# Patient Record
Sex: Female | Born: 1950 | Race: Black or African American | Hispanic: No | Marital: Married | Smoking: Never smoker
Health system: Southern US, Community
[De-identification: ages and names within clinical notes are randomized; demographics above are authoritative.]

## PROBLEM LIST (undated history)

## (undated) DIAGNOSIS — I10 Essential (primary) hypertension: Secondary | ICD-10-CM

## (undated) DIAGNOSIS — J45909 Unspecified asthma, uncomplicated: Secondary | ICD-10-CM

## (undated) DIAGNOSIS — R7303 Prediabetes: Secondary | ICD-10-CM

## (undated) HISTORY — DX: Unspecified asthma, uncomplicated: J45.909

## (undated) HISTORY — DX: Essential (primary) hypertension: I10

## (undated) HISTORY — DX: Prediabetes: R73.03

---

## 2007-01-24 ENCOUNTER — Ambulatory Visit (HOSPITAL_COMMUNITY): Admission: RE | Admit: 2007-01-24 | Discharge: 2007-01-24 | Payer: Self-pay | Admitting: Family Medicine

## 2007-12-03 ENCOUNTER — Ambulatory Visit (HOSPITAL_COMMUNITY): Admission: RE | Admit: 2007-12-03 | Discharge: 2007-12-03 | Payer: Self-pay | Admitting: Family Medicine

## 2008-12-24 IMAGING — CT CT CHEST W/ CM
1 series · 15 of 31 positions shown, 19 images · IV contrast (Omnipaque 300)
Comparison: Prior outside exam not provided for comparison.

CLINICAL DATA: Follow up abnormal CT chest with reported focal
density left apex

CT CHEST WITH CONTRAST
TECHNIQUE: Multidetector CT imaging of the chest was performed
following the standard protocol during bolus administration of
intravenous contrast. Sagittal and coronal images reconstructed
from axial data set.
Contrast: 80 ml Xmnipaque-022

[Series 2: chestroutine 5.0 b40f · axial · 0.63mm/px · z∈[-302,-38]mm · 15 of 59 slices shown, 19 images]
[im 3/59  mediastinal]
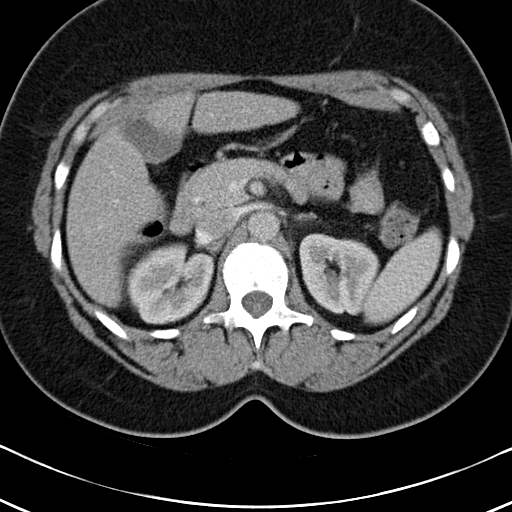
[im 3/59  lung]
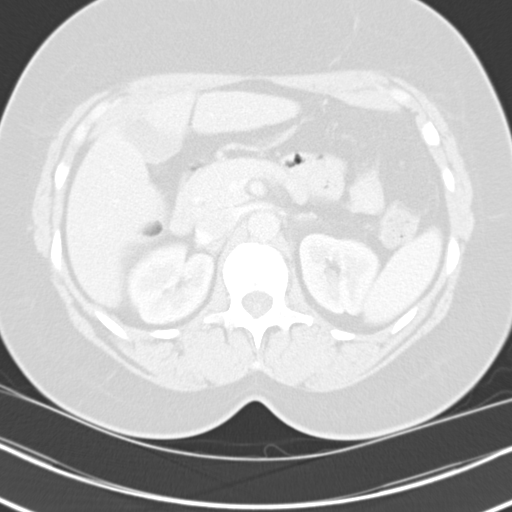
[im 7/59  lung]
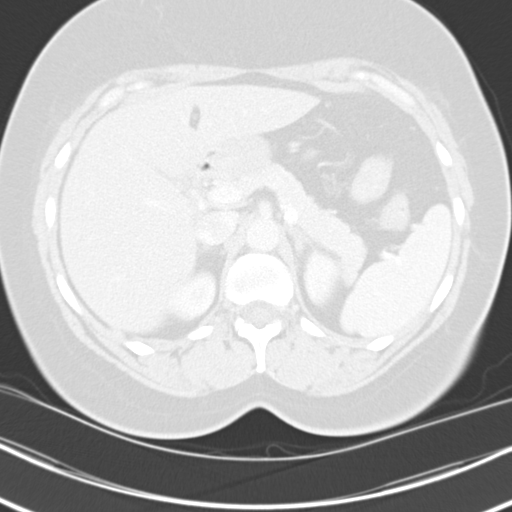
[im 11/59  lung]
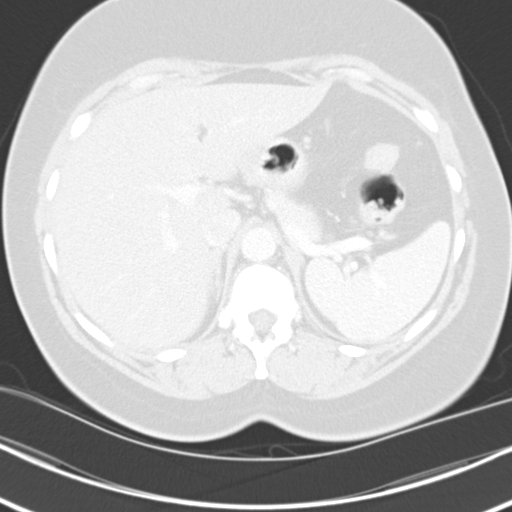
[im 13/59  lung]
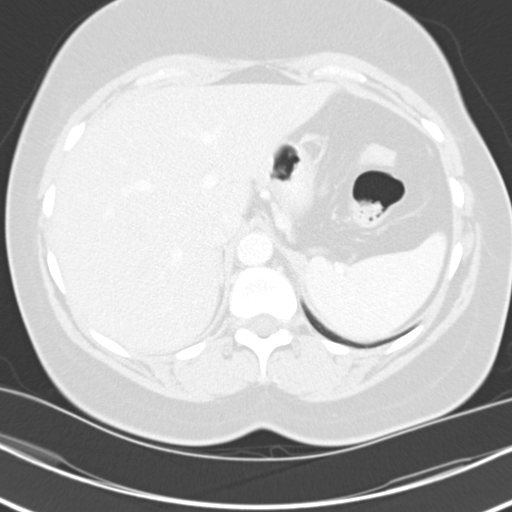
[im 18/59  mediastinal]
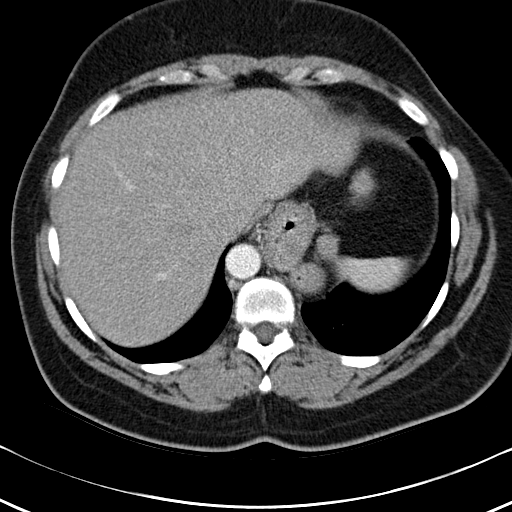
[im 18/59  lung]
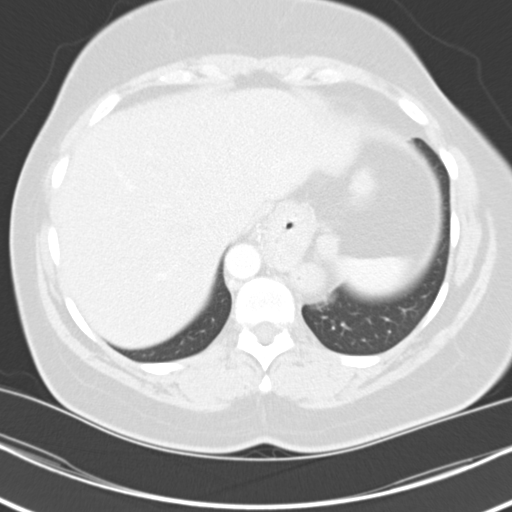
[im 22/59  lung]
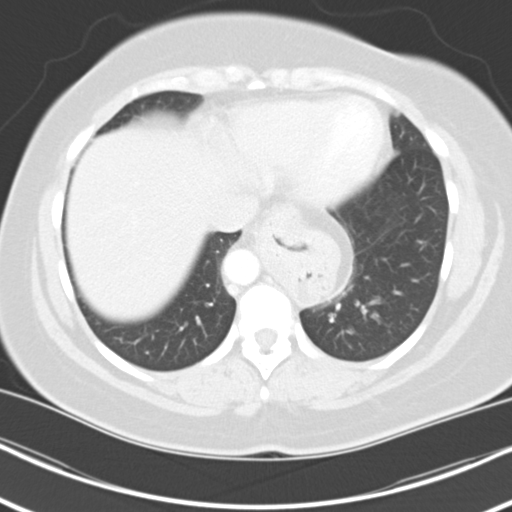
[im 26/59  lung]
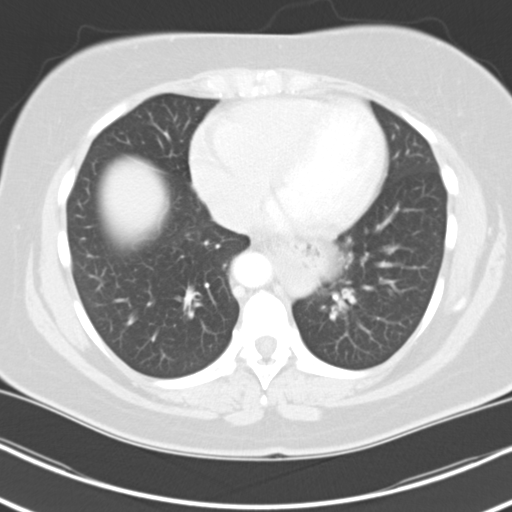
[im 31/59  lung]
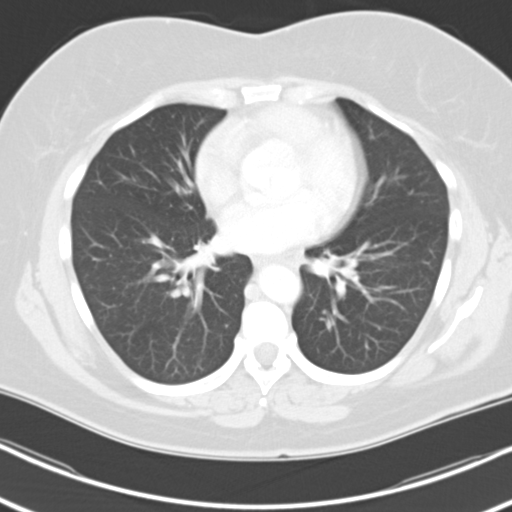
[im 33/59  mediastinal]
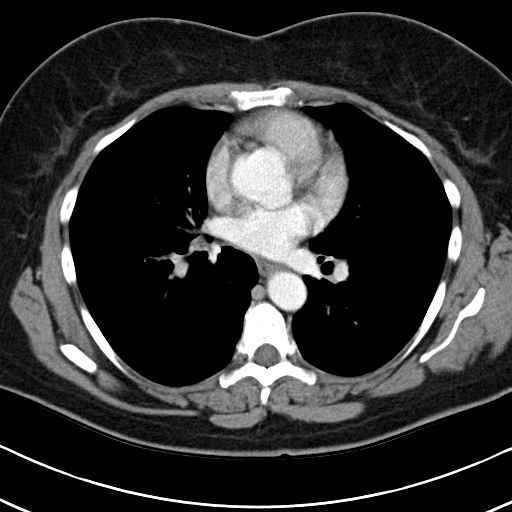
[im 33/59  lung]
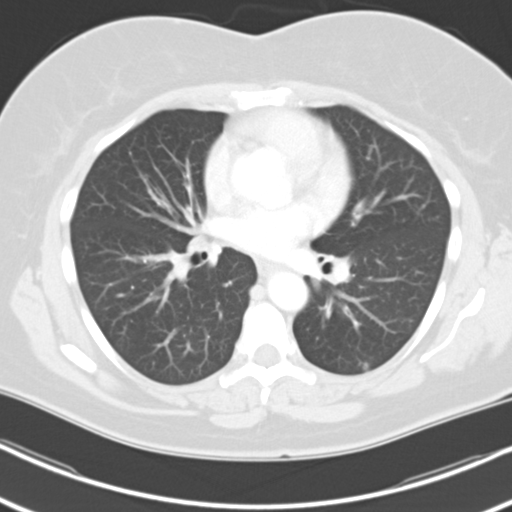
[im 37/59  lung]
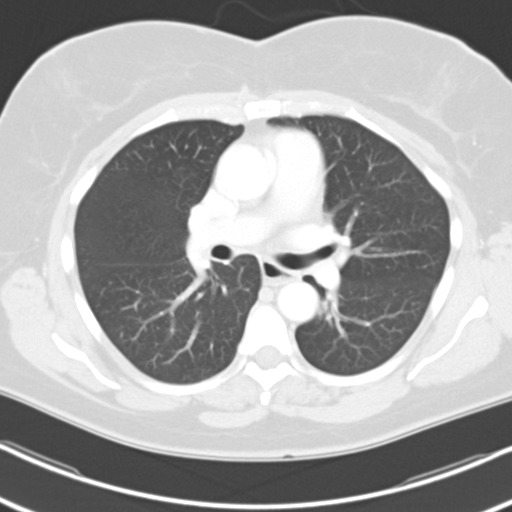
[im 41/59  lung]
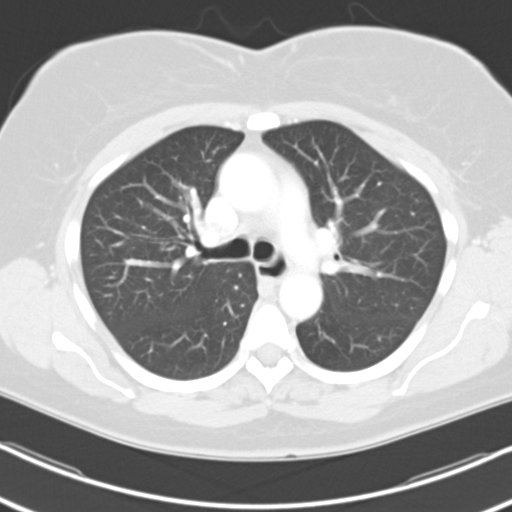
[im 46/59  lung]
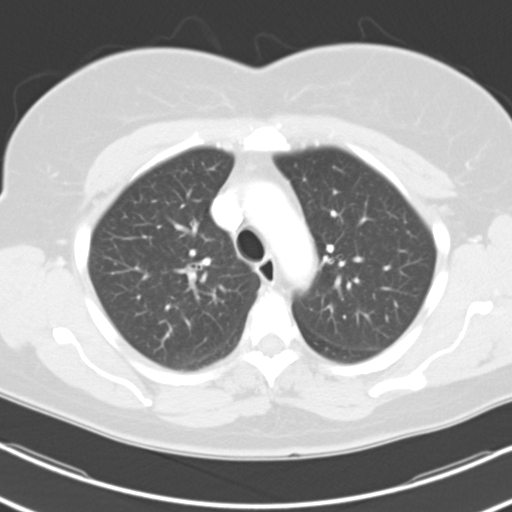
[im 48/59  mediastinal]
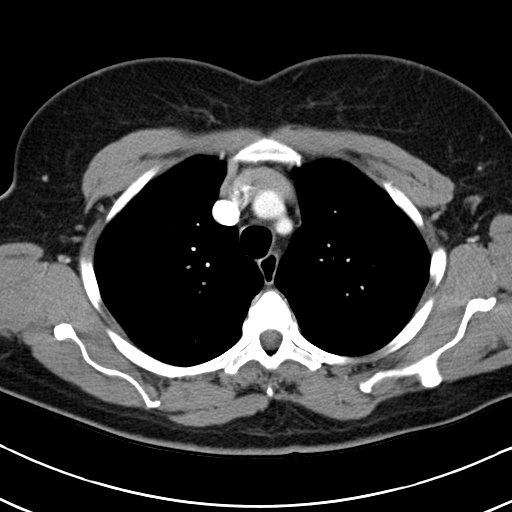
[im 48/59  lung]
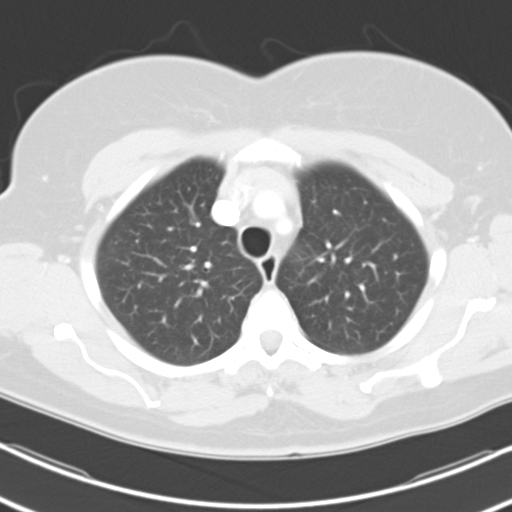
[im 52/59  lung]
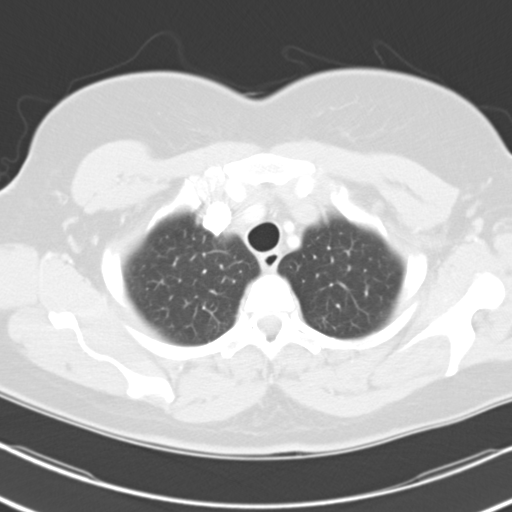
[im 56/59  lung]
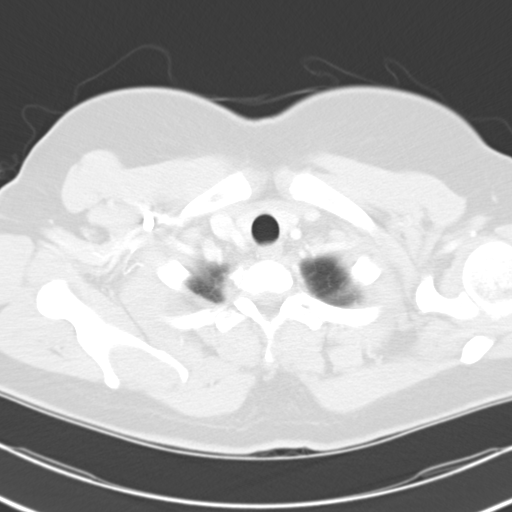

[15 of 31 positions shown; findings below may reference images not displayed]

FINDINGS: Thoracic vascular structures grossly patent on non dedicated exam.
Moderate sized hiatal hernia, accounting for retrocardiac
paramediastinal density on prior chest x-ray of 01/24/2007.
No thoracic adenopathy.
Visualized portion of upper abdomen normal.
7 x 7 mm diameter noncalcified nodular density left apex image six,
nonspecific.
Minimal peribronchial thickening.
Questionable tiny 3 mm diameter nodular density or focus of
infiltrate in left lower lobe image 27.
Remaining lungs clear.
No pleural effusion, infiltrate, or dominant mass.
Bones unremarkable.
IMPRESSION: 7 mm diameter nonspecific nodular density left apex, uncertain
significance.
Recommend prior outside exam be obtained for direct comparison
stability.
In the absence of prior studies, recommend follow-up CT chest in 6
months if patient has risk factors for lung cancer, or 9 to12
months in the absence of risk factors.

## 2010-07-03 ENCOUNTER — Encounter: Payer: Self-pay | Admitting: Family Medicine

## 2013-05-21 ENCOUNTER — Telehealth (HOSPITAL_COMMUNITY): Payer: Self-pay | Admitting: Dietician

## 2013-05-21 NOTE — Telephone Encounter (Signed)
Received call at 1410. Scheduled to attend DM Class on 05/19/13 at 1730.

## 2013-06-17 ENCOUNTER — Telehealth (HOSPITAL_COMMUNITY): Payer: Self-pay | Admitting: Dietician

## 2013-06-17 NOTE — Telephone Encounter (Signed)
Received call at 1524. Pt would like to cancel class appointment on 06/19/13, due to death in family. Pt wants to rescheduled for 08/07/13 at 1730.

## 2017-04-02 DIAGNOSIS — J302 Other seasonal allergic rhinitis: Secondary | ICD-10-CM | POA: Diagnosis not present

## 2017-04-02 DIAGNOSIS — I10 Essential (primary) hypertension: Secondary | ICD-10-CM | POA: Diagnosis not present

## 2017-04-02 DIAGNOSIS — J4530 Mild persistent asthma, uncomplicated: Secondary | ICD-10-CM | POA: Diagnosis not present

## 2017-04-02 DIAGNOSIS — E119 Type 2 diabetes mellitus without complications: Secondary | ICD-10-CM | POA: Diagnosis not present

## 2017-04-02 DIAGNOSIS — Z1389 Encounter for screening for other disorder: Secondary | ICD-10-CM | POA: Diagnosis not present

## 2017-06-19 DIAGNOSIS — E119 Type 2 diabetes mellitus without complications: Secondary | ICD-10-CM | POA: Diagnosis not present

## 2017-09-17 DIAGNOSIS — E782 Mixed hyperlipidemia: Secondary | ICD-10-CM | POA: Diagnosis not present

## 2017-09-17 DIAGNOSIS — E119 Type 2 diabetes mellitus without complications: Secondary | ICD-10-CM | POA: Diagnosis not present

## 2017-09-17 DIAGNOSIS — I10 Essential (primary) hypertension: Secondary | ICD-10-CM | POA: Diagnosis not present

## 2017-09-17 DIAGNOSIS — J4530 Mild persistent asthma, uncomplicated: Secondary | ICD-10-CM | POA: Diagnosis not present

## 2017-09-17 DIAGNOSIS — J302 Other seasonal allergic rhinitis: Secondary | ICD-10-CM | POA: Diagnosis not present

## 2017-09-17 DIAGNOSIS — Z1389 Encounter for screening for other disorder: Secondary | ICD-10-CM | POA: Diagnosis not present

## 2018-04-15 DIAGNOSIS — E782 Mixed hyperlipidemia: Secondary | ICD-10-CM | POA: Diagnosis not present

## 2018-04-15 DIAGNOSIS — E119 Type 2 diabetes mellitus without complications: Secondary | ICD-10-CM | POA: Diagnosis not present

## 2018-04-15 DIAGNOSIS — I10 Essential (primary) hypertension: Secondary | ICD-10-CM | POA: Diagnosis not present

## 2018-04-15 DIAGNOSIS — Z0001 Encounter for general adult medical examination with abnormal findings: Secondary | ICD-10-CM | POA: Diagnosis not present

## 2018-04-15 DIAGNOSIS — J302 Other seasonal allergic rhinitis: Secondary | ICD-10-CM | POA: Diagnosis not present

## 2018-04-15 DIAGNOSIS — Z1389 Encounter for screening for other disorder: Secondary | ICD-10-CM | POA: Diagnosis not present

## 2018-06-18 DIAGNOSIS — M1991 Primary osteoarthritis, unspecified site: Secondary | ICD-10-CM | POA: Diagnosis not present

## 2018-06-18 DIAGNOSIS — M79644 Pain in right finger(s): Secondary | ICD-10-CM | POA: Diagnosis not present

## 2018-06-18 DIAGNOSIS — M20091 Other deformity of right finger(s): Secondary | ICD-10-CM | POA: Diagnosis not present

## 2018-07-24 DIAGNOSIS — G5702 Lesion of sciatic nerve, left lower limb: Secondary | ICD-10-CM | POA: Diagnosis not present

## 2018-07-24 DIAGNOSIS — M5410 Radiculopathy, site unspecified: Secondary | ICD-10-CM | POA: Diagnosis not present

## 2018-08-13 DIAGNOSIS — Z1389 Encounter for screening for other disorder: Secondary | ICD-10-CM | POA: Diagnosis not present

## 2018-08-13 DIAGNOSIS — R197 Diarrhea, unspecified: Secondary | ICD-10-CM | POA: Diagnosis not present

## 2018-08-23 DIAGNOSIS — M545 Low back pain: Secondary | ICD-10-CM | POA: Diagnosis not present

## 2018-08-23 DIAGNOSIS — M25551 Pain in right hip: Secondary | ICD-10-CM | POA: Diagnosis not present

## 2018-09-30 DIAGNOSIS — E1165 Type 2 diabetes mellitus with hyperglycemia: Secondary | ICD-10-CM | POA: Diagnosis not present

## 2018-09-30 DIAGNOSIS — Z1389 Encounter for screening for other disorder: Secondary | ICD-10-CM | POA: Diagnosis not present

## 2018-09-30 DIAGNOSIS — Z0000 Encounter for general adult medical examination without abnormal findings: Secondary | ICD-10-CM | POA: Diagnosis not present

## 2018-09-30 DIAGNOSIS — M1991 Primary osteoarthritis, unspecified site: Secondary | ICD-10-CM | POA: Diagnosis not present

## 2018-11-27 DIAGNOSIS — E1165 Type 2 diabetes mellitus with hyperglycemia: Secondary | ICD-10-CM | POA: Diagnosis not present

## 2018-11-27 DIAGNOSIS — I10 Essential (primary) hypertension: Secondary | ICD-10-CM | POA: Diagnosis not present

## 2018-11-27 DIAGNOSIS — Z1389 Encounter for screening for other disorder: Secondary | ICD-10-CM | POA: Diagnosis not present

## 2018-11-27 DIAGNOSIS — E7849 Other hyperlipidemia: Secondary | ICD-10-CM | POA: Diagnosis not present

## 2019-03-03 ENCOUNTER — Other Ambulatory Visit: Payer: Self-pay

## 2019-03-03 NOTE — Patient Outreach (Signed)
[facility]) Care Management  03/03/2019  Caitlin Miller 1950-06-30 [id number]   Medication Adherence call to Mrs. Caitlin Miller [facility] Ident[facility] Verify spoke with patient she is past due on Metformin 1000 mg and Olmesartan/Amlodipine/Hctz patient explain she is only taking 1/2 tablet of metformin 2 times a day and 1/2 tablet on Olmesartan/Amlodipine/Hctz per doctors instructions patient will ask doctor to give her a new prescription stating new instruction on her next appointment. Caitlin Miller is showing past due under [place].  [place] [facility] Direct Dial [phone]  Fax [phone] [email or web address]

## 2019-05-12 DIAGNOSIS — J4530 Mild persistent asthma, uncomplicated: Secondary | ICD-10-CM | POA: Diagnosis not present

## 2019-05-12 DIAGNOSIS — M1991 Primary osteoarthritis, unspecified site: Secondary | ICD-10-CM | POA: Diagnosis not present

## 2019-05-12 DIAGNOSIS — E119 Type 2 diabetes mellitus without complications: Secondary | ICD-10-CM | POA: Diagnosis not present

## 2019-05-12 DIAGNOSIS — I10 Essential (primary) hypertension: Secondary | ICD-10-CM | POA: Diagnosis not present

## 2019-07-13 DIAGNOSIS — E7849 Other hyperlipidemia: Secondary | ICD-10-CM | POA: Diagnosis not present

## 2019-07-13 DIAGNOSIS — M1991 Primary osteoarthritis, unspecified site: Secondary | ICD-10-CM | POA: Diagnosis not present

## 2019-07-13 DIAGNOSIS — J4530 Mild persistent asthma, uncomplicated: Secondary | ICD-10-CM | POA: Diagnosis not present

## 2019-07-13 DIAGNOSIS — I10 Essential (primary) hypertension: Secondary | ICD-10-CM | POA: Diagnosis not present

## 2019-07-20 ENCOUNTER — Ambulatory Visit: Payer: Medicare Other | Attending: Internal Medicine

## 2019-07-20 ENCOUNTER — Other Ambulatory Visit: Payer: Self-pay

## 2019-07-20 DIAGNOSIS — Z23 Encounter for immunization: Secondary | ICD-10-CM | POA: Insufficient documentation

## 2019-07-20 NOTE — Progress Notes (Signed)
   Covid-19 Vaccination Clinic  Name:  Caitlin Miller    MRN: [id number] DOB: 06-Dec-1950  07/20/2019  Caitlin Miller was observed post Covid-19 immunization for 15 minutes without incidence. She was provided with Vaccine Information Sheet and instruction to access the V-Safe system.   Caitlin Miller was instructed to call 911 with any severe reactions post vaccine: Marland Kitchen Difficulty breathing  . Swelling of your face and throat  . A fast heartbeat  . A bad rash all over your body  . Dizziness and weakness    Immunizations Administered    Name Date Dose VIS Date Route   Moderna COVID-19 Vaccine 07/20/2019 12:38 PM 0.5 mL 05/13/2019 Intramuscular   Manufacturer: Moderna   Lot: [id number]   NDC: 48350-757-32

## 2019-08-10 DIAGNOSIS — J4530 Mild persistent asthma, uncomplicated: Secondary | ICD-10-CM | POA: Diagnosis not present

## 2019-08-10 DIAGNOSIS — E119 Type 2 diabetes mellitus without complications: Secondary | ICD-10-CM | POA: Diagnosis not present

## 2019-08-10 DIAGNOSIS — M1991 Primary osteoarthritis, unspecified site: Secondary | ICD-10-CM | POA: Diagnosis not present

## 2019-08-10 DIAGNOSIS — I10 Essential (primary) hypertension: Secondary | ICD-10-CM | POA: Diagnosis not present

## 2019-08-20 ENCOUNTER — Ambulatory Visit: Payer: Medicare Other | Attending: Internal Medicine

## 2019-08-20 DIAGNOSIS — Z23 Encounter for immunization: Secondary | ICD-10-CM | POA: Insufficient documentation

## 2019-08-20 NOTE — Progress Notes (Signed)
   Covid-19 Vaccination Clinic  Name:  MADIE CAHN    MRN: [id number] DOB: 1951/03/24  08/20/2019  Ms. Fenn was observed post Covid-19 immunization for 15 minutes without incident. She was provided with Vaccine Information Sheet and instruction to access the V-Safe system.   Ms. Gubler was instructed to call 911 with any severe reactions post vaccine: Marland Kitchen Difficulty breathing  . Swelling of face and throat  . A fast heartbeat  . A bad rash all over body  . Dizziness and weakness   Immunizations Administered    Name Date Dose VIS Date Route   Moderna COVID-19 Vaccine 08/20/2019  1:19 PM 0.5 mL 05/13/2019 Intramuscular   Manufacturer: Moderna   Lot: [id number]   NDC: 82641-583-09

## 2019-09-10 DIAGNOSIS — J4530 Mild persistent asthma, uncomplicated: Secondary | ICD-10-CM | POA: Diagnosis not present

## 2019-09-10 DIAGNOSIS — E119 Type 2 diabetes mellitus without complications: Secondary | ICD-10-CM | POA: Diagnosis not present

## 2019-09-10 DIAGNOSIS — I10 Essential (primary) hypertension: Secondary | ICD-10-CM | POA: Diagnosis not present

## 2019-09-10 DIAGNOSIS — M1991 Primary osteoarthritis, unspecified site: Secondary | ICD-10-CM | POA: Diagnosis not present

## 2019-09-23 DIAGNOSIS — E119 Type 2 diabetes mellitus without complications: Secondary | ICD-10-CM | POA: Diagnosis not present

## 2019-09-23 DIAGNOSIS — I10 Essential (primary) hypertension: Secondary | ICD-10-CM | POA: Diagnosis not present

## 2019-09-23 DIAGNOSIS — Z0000 Encounter for general adult medical examination without abnormal findings: Secondary | ICD-10-CM | POA: Diagnosis not present

## 2019-09-23 DIAGNOSIS — M1991 Primary osteoarthritis, unspecified site: Secondary | ICD-10-CM | POA: Diagnosis not present

## 2019-09-23 DIAGNOSIS — Z1389 Encounter for screening for other disorder: Secondary | ICD-10-CM | POA: Diagnosis not present

## 2019-09-23 DIAGNOSIS — E1165 Type 2 diabetes mellitus with hyperglycemia: Secondary | ICD-10-CM | POA: Diagnosis not present

## 2019-09-23 DIAGNOSIS — E7849 Other hyperlipidemia: Secondary | ICD-10-CM | POA: Diagnosis not present

## 2019-10-10 DIAGNOSIS — E119 Type 2 diabetes mellitus without complications: Secondary | ICD-10-CM | POA: Diagnosis not present

## 2019-10-10 DIAGNOSIS — M1991 Primary osteoarthritis, unspecified site: Secondary | ICD-10-CM | POA: Diagnosis not present

## 2019-10-10 DIAGNOSIS — I10 Essential (primary) hypertension: Secondary | ICD-10-CM | POA: Diagnosis not present

## 2019-10-10 DIAGNOSIS — J4530 Mild persistent asthma, uncomplicated: Secondary | ICD-10-CM | POA: Diagnosis not present

## 2019-12-10 DIAGNOSIS — M1991 Primary osteoarthritis, unspecified site: Secondary | ICD-10-CM | POA: Diagnosis not present

## 2019-12-10 DIAGNOSIS — E119 Type 2 diabetes mellitus without complications: Secondary | ICD-10-CM | POA: Diagnosis not present

## 2019-12-10 DIAGNOSIS — J4530 Mild persistent asthma, uncomplicated: Secondary | ICD-10-CM | POA: Diagnosis not present

## 2019-12-10 DIAGNOSIS — I10 Essential (primary) hypertension: Secondary | ICD-10-CM | POA: Diagnosis not present

## 2020-01-09 DIAGNOSIS — M1991 Primary osteoarthritis, unspecified site: Secondary | ICD-10-CM | POA: Diagnosis not present

## 2020-01-09 DIAGNOSIS — J4530 Mild persistent asthma, uncomplicated: Secondary | ICD-10-CM | POA: Diagnosis not present

## 2020-01-09 DIAGNOSIS — I10 Essential (primary) hypertension: Secondary | ICD-10-CM | POA: Diagnosis not present

## 2020-01-09 DIAGNOSIS — E119 Type 2 diabetes mellitus without complications: Secondary | ICD-10-CM | POA: Diagnosis not present

## 2020-01-19 DIAGNOSIS — E119 Type 2 diabetes mellitus without complications: Secondary | ICD-10-CM | POA: Diagnosis not present

## 2020-03-11 DIAGNOSIS — J4530 Mild persistent asthma, uncomplicated: Secondary | ICD-10-CM | POA: Diagnosis not present

## 2020-03-11 DIAGNOSIS — E119 Type 2 diabetes mellitus without complications: Secondary | ICD-10-CM | POA: Diagnosis not present

## 2020-03-11 DIAGNOSIS — I10 Essential (primary) hypertension: Secondary | ICD-10-CM | POA: Diagnosis not present

## 2020-03-11 DIAGNOSIS — M1991 Primary osteoarthritis, unspecified site: Secondary | ICD-10-CM | POA: Diagnosis not present

## 2020-03-16 DIAGNOSIS — I10 Essential (primary) hypertension: Secondary | ICD-10-CM | POA: Diagnosis not present

## 2020-03-16 DIAGNOSIS — E7849 Other hyperlipidemia: Secondary | ICD-10-CM | POA: Diagnosis not present

## 2020-03-16 DIAGNOSIS — E1165 Type 2 diabetes mellitus with hyperglycemia: Secondary | ICD-10-CM | POA: Diagnosis not present

## 2020-03-16 DIAGNOSIS — J302 Other seasonal allergic rhinitis: Secondary | ICD-10-CM | POA: Diagnosis not present

## 2020-04-10 DIAGNOSIS — M1991 Primary osteoarthritis, unspecified site: Secondary | ICD-10-CM | POA: Diagnosis not present

## 2020-04-10 DIAGNOSIS — J4530 Mild persistent asthma, uncomplicated: Secondary | ICD-10-CM | POA: Diagnosis not present

## 2020-04-10 DIAGNOSIS — E119 Type 2 diabetes mellitus without complications: Secondary | ICD-10-CM | POA: Diagnosis not present

## 2020-04-10 DIAGNOSIS — I10 Essential (primary) hypertension: Secondary | ICD-10-CM | POA: Diagnosis not present

## 2020-09-08 DIAGNOSIS — E119 Type 2 diabetes mellitus without complications: Secondary | ICD-10-CM | POA: Diagnosis not present

## 2020-09-08 DIAGNOSIS — I10 Essential (primary) hypertension: Secondary | ICD-10-CM | POA: Diagnosis not present

## 2020-09-08 DIAGNOSIS — J4530 Mild persistent asthma, uncomplicated: Secondary | ICD-10-CM | POA: Diagnosis not present

## 2020-09-08 DIAGNOSIS — M1991 Primary osteoarthritis, unspecified site: Secondary | ICD-10-CM | POA: Diagnosis not present

## 2020-09-14 DIAGNOSIS — J4530 Mild persistent asthma, uncomplicated: Secondary | ICD-10-CM | POA: Diagnosis not present

## 2020-09-14 DIAGNOSIS — I10 Essential (primary) hypertension: Secondary | ICD-10-CM | POA: Diagnosis not present

## 2020-09-14 DIAGNOSIS — E7849 Other hyperlipidemia: Secondary | ICD-10-CM | POA: Diagnosis not present

## 2020-09-14 DIAGNOSIS — Z1389 Encounter for screening for other disorder: Secondary | ICD-10-CM | POA: Diagnosis not present

## 2020-09-21 ENCOUNTER — Other Ambulatory Visit (HOSPITAL_COMMUNITY): Payer: Self-pay | Admitting: Family Medicine

## 2020-09-21 DIAGNOSIS — E2839 Other primary ovarian failure: Secondary | ICD-10-CM

## 2020-10-09 DIAGNOSIS — J4530 Mild persistent asthma, uncomplicated: Secondary | ICD-10-CM | POA: Diagnosis not present

## 2020-10-09 DIAGNOSIS — I10 Essential (primary) hypertension: Secondary | ICD-10-CM | POA: Diagnosis not present

## 2020-10-09 DIAGNOSIS — E119 Type 2 diabetes mellitus without complications: Secondary | ICD-10-CM | POA: Diagnosis not present

## 2020-10-09 DIAGNOSIS — M1991 Primary osteoarthritis, unspecified site: Secondary | ICD-10-CM | POA: Diagnosis not present

## 2020-11-09 DIAGNOSIS — I10 Essential (primary) hypertension: Secondary | ICD-10-CM | POA: Diagnosis not present

## 2020-11-09 DIAGNOSIS — M1991 Primary osteoarthritis, unspecified site: Secondary | ICD-10-CM | POA: Diagnosis not present

## 2020-11-09 DIAGNOSIS — J4530 Mild persistent asthma, uncomplicated: Secondary | ICD-10-CM | POA: Diagnosis not present

## 2020-11-09 DIAGNOSIS — E119 Type 2 diabetes mellitus without complications: Secondary | ICD-10-CM | POA: Diagnosis not present

## 2021-01-09 DIAGNOSIS — E119 Type 2 diabetes mellitus without complications: Secondary | ICD-10-CM | POA: Diagnosis not present

## 2021-01-09 DIAGNOSIS — M1991 Primary osteoarthritis, unspecified site: Secondary | ICD-10-CM | POA: Diagnosis not present

## 2021-01-09 DIAGNOSIS — I10 Essential (primary) hypertension: Secondary | ICD-10-CM | POA: Diagnosis not present

## 2021-01-19 DIAGNOSIS — E119 Type 2 diabetes mellitus without complications: Secondary | ICD-10-CM | POA: Diagnosis not present

## 2021-01-19 DIAGNOSIS — E113293 Type 2 diabetes mellitus with mild nonproliferative diabetic retinopathy without macular edema, bilateral: Secondary | ICD-10-CM | POA: Diagnosis not present

## 2021-03-18 DIAGNOSIS — J4530 Mild persistent asthma, uncomplicated: Secondary | ICD-10-CM | POA: Diagnosis not present

## 2021-03-18 DIAGNOSIS — E782 Mixed hyperlipidemia: Secondary | ICD-10-CM | POA: Diagnosis not present

## 2021-03-18 DIAGNOSIS — I10 Essential (primary) hypertension: Secondary | ICD-10-CM | POA: Diagnosis not present

## 2021-03-18 DIAGNOSIS — E1165 Type 2 diabetes mellitus with hyperglycemia: Secondary | ICD-10-CM | POA: Diagnosis not present

## 2021-03-18 DIAGNOSIS — M1991 Primary osteoarthritis, unspecified site: Secondary | ICD-10-CM | POA: Diagnosis not present

## 2021-04-25 DIAGNOSIS — Z0000 Encounter for general adult medical examination without abnormal findings: Secondary | ICD-10-CM | POA: Diagnosis not present

## 2021-04-25 DIAGNOSIS — I10 Essential (primary) hypertension: Secondary | ICD-10-CM | POA: Diagnosis not present

## 2021-04-25 DIAGNOSIS — J4530 Mild persistent asthma, uncomplicated: Secondary | ICD-10-CM | POA: Diagnosis not present

## 2021-04-25 DIAGNOSIS — M1991 Primary osteoarthritis, unspecified site: Secondary | ICD-10-CM | POA: Diagnosis not present

## 2021-04-25 DIAGNOSIS — E1165 Type 2 diabetes mellitus with hyperglycemia: Secondary | ICD-10-CM | POA: Diagnosis not present

## 2021-04-25 DIAGNOSIS — E782 Mixed hyperlipidemia: Secondary | ICD-10-CM | POA: Diagnosis not present

## 2021-08-09 DIAGNOSIS — I10 Essential (primary) hypertension: Secondary | ICD-10-CM | POA: Diagnosis not present

## 2021-08-09 DIAGNOSIS — E119 Type 2 diabetes mellitus without complications: Secondary | ICD-10-CM | POA: Diagnosis not present

## 2021-08-09 DIAGNOSIS — M1990 Unspecified osteoarthritis, unspecified site: Secondary | ICD-10-CM | POA: Diagnosis not present

## 2021-09-20 DIAGNOSIS — M1991 Primary osteoarthritis, unspecified site: Secondary | ICD-10-CM | POA: Diagnosis not present

## 2021-09-20 DIAGNOSIS — I10 Essential (primary) hypertension: Secondary | ICD-10-CM | POA: Diagnosis not present

## 2021-09-20 DIAGNOSIS — J4530 Mild persistent asthma, uncomplicated: Secondary | ICD-10-CM | POA: Diagnosis not present

## 2021-09-20 DIAGNOSIS — E1165 Type 2 diabetes mellitus with hyperglycemia: Secondary | ICD-10-CM | POA: Diagnosis not present

## 2021-09-20 DIAGNOSIS — E7849 Other hyperlipidemia: Secondary | ICD-10-CM | POA: Diagnosis not present

## 2021-09-20 DIAGNOSIS — E782 Mixed hyperlipidemia: Secondary | ICD-10-CM | POA: Diagnosis not present

## 2022-01-23 DIAGNOSIS — E119 Type 2 diabetes mellitus without complications: Secondary | ICD-10-CM | POA: Diagnosis not present

## 2022-01-24 DIAGNOSIS — J4530 Mild persistent asthma, uncomplicated: Secondary | ICD-10-CM | POA: Diagnosis not present

## 2022-01-24 DIAGNOSIS — Z0001 Encounter for general adult medical examination with abnormal findings: Secondary | ICD-10-CM | POA: Diagnosis not present

## 2022-01-24 DIAGNOSIS — E782 Mixed hyperlipidemia: Secondary | ICD-10-CM | POA: Diagnosis not present

## 2022-01-24 DIAGNOSIS — E1165 Type 2 diabetes mellitus with hyperglycemia: Secondary | ICD-10-CM | POA: Diagnosis not present

## 2022-01-24 DIAGNOSIS — I10 Essential (primary) hypertension: Secondary | ICD-10-CM | POA: Diagnosis not present

## 2022-02-24 DIAGNOSIS — E1165 Type 2 diabetes mellitus with hyperglycemia: Secondary | ICD-10-CM | POA: Diagnosis not present

## 2022-04-11 DIAGNOSIS — E119 Type 2 diabetes mellitus without complications: Secondary | ICD-10-CM | POA: Diagnosis not present

## 2022-04-11 DIAGNOSIS — I10 Essential (primary) hypertension: Secondary | ICD-10-CM | POA: Diagnosis not present

## 2022-04-11 DIAGNOSIS — M1991 Primary osteoarthritis, unspecified site: Secondary | ICD-10-CM | POA: Diagnosis not present

## 2022-06-15 DIAGNOSIS — J069 Acute upper respiratory infection, unspecified: Secondary | ICD-10-CM | POA: Diagnosis not present

## 2022-06-15 DIAGNOSIS — J4530 Mild persistent asthma, uncomplicated: Secondary | ICD-10-CM | POA: Diagnosis not present

## 2022-09-27 DIAGNOSIS — E1165 Type 2 diabetes mellitus with hyperglycemia: Secondary | ICD-10-CM | POA: Diagnosis not present

## 2022-09-27 DIAGNOSIS — E7849 Other hyperlipidemia: Secondary | ICD-10-CM | POA: Diagnosis not present

## 2022-09-27 DIAGNOSIS — I10 Essential (primary) hypertension: Secondary | ICD-10-CM | POA: Diagnosis not present

## 2022-09-27 DIAGNOSIS — E782 Mixed hyperlipidemia: Secondary | ICD-10-CM | POA: Diagnosis not present

## 2022-09-27 DIAGNOSIS — J4530 Mild persistent asthma, uncomplicated: Secondary | ICD-10-CM | POA: Diagnosis not present

## 2022-09-27 DIAGNOSIS — Z0001 Encounter for general adult medical examination with abnormal findings: Secondary | ICD-10-CM | POA: Diagnosis not present

## 2023-01-29 DIAGNOSIS — E782 Mixed hyperlipidemia: Secondary | ICD-10-CM | POA: Diagnosis not present

## 2023-01-29 DIAGNOSIS — E1165 Type 2 diabetes mellitus with hyperglycemia: Secondary | ICD-10-CM | POA: Diagnosis not present

## 2023-01-29 DIAGNOSIS — J4530 Mild persistent asthma, uncomplicated: Secondary | ICD-10-CM | POA: Diagnosis not present

## 2023-01-29 DIAGNOSIS — I10 Essential (primary) hypertension: Secondary | ICD-10-CM | POA: Diagnosis not present

## 2023-01-29 DIAGNOSIS — Z0000 Encounter for general adult medical examination without abnormal findings: Secondary | ICD-10-CM | POA: Diagnosis not present

## 2023-01-29 DIAGNOSIS — G4733 Obstructive sleep apnea (adult) (pediatric): Secondary | ICD-10-CM | POA: Diagnosis not present

## 2023-02-17 ENCOUNTER — Other Ambulatory Visit: Payer: Self-pay

## 2023-02-17 ENCOUNTER — Emergency Department (HOSPITAL_COMMUNITY)
Admission: EM | Admit: 2023-02-17 | Discharge: 2023-02-17 | Disposition: A | Discharge status: Home / Self Care | Payer: Medicare Other | Attending: Emergency Medicine | Admitting: Emergency Medicine

## 2023-02-17 ENCOUNTER — Emergency Department (HOSPITAL_COMMUNITY): Payer: Medicare Other

## 2023-02-17 ENCOUNTER — Encounter (HOSPITAL_COMMUNITY): Payer: Self-pay

## 2023-02-17 DIAGNOSIS — S025XXA Fracture of tooth (traumatic), initial encounter for closed fracture: Secondary | ICD-10-CM | POA: Diagnosis not present

## 2023-02-17 DIAGNOSIS — S61411A Laceration without foreign body of right hand, initial encounter: Secondary | ICD-10-CM | POA: Diagnosis not present

## 2023-02-17 DIAGNOSIS — S8001XA Contusion of right knee, initial encounter: Secondary | ICD-10-CM | POA: Insufficient documentation

## 2023-02-17 DIAGNOSIS — S6991XA Unspecified injury of right wrist, hand and finger(s), initial encounter: Secondary | ICD-10-CM | POA: Diagnosis not present

## 2023-02-17 DIAGNOSIS — S60221A Contusion of right hand, initial encounter: Secondary | ICD-10-CM | POA: Diagnosis not present

## 2023-02-17 DIAGNOSIS — M25541 Pain in joints of right hand: Secondary | ICD-10-CM | POA: Diagnosis not present

## 2023-02-17 DIAGNOSIS — M1811 Unilateral primary osteoarthritis of first carpometacarpal joint, right hand: Secondary | ICD-10-CM | POA: Diagnosis not present

## 2023-02-17 DIAGNOSIS — W0110XA Fall on same level from slipping, tripping and stumbling with subsequent striking against unspecified object, initial encounter: Secondary | ICD-10-CM | POA: Insufficient documentation

## 2023-02-17 DIAGNOSIS — S01511A Laceration without foreign body of lip, initial encounter: Secondary | ICD-10-CM | POA: Insufficient documentation

## 2023-02-17 DIAGNOSIS — I10 Essential (primary) hypertension: Secondary | ICD-10-CM | POA: Diagnosis not present

## 2023-02-17 DIAGNOSIS — Z23 Encounter for immunization: Secondary | ICD-10-CM | POA: Insufficient documentation

## 2023-02-17 DIAGNOSIS — S81011A Laceration without foreign body, right knee, initial encounter: Secondary | ICD-10-CM | POA: Diagnosis not present

## 2023-02-17 MED ORDER — BACITRACIN ZINC 500 UNIT/GM EX OINT
TOPICAL_OINTMENT | CUTANEOUS | Status: DC | PRN
  Filled 2023-02-17: qty 0.9

## 2023-02-17 MED ORDER — LIDOCAINE-EPINEPHRINE 2 %-1:100000 IJ SOLN
20.0000 mL | Freq: Once | INTRAMUSCULAR | Status: DC
  Filled 2023-02-17: qty 20

## 2023-02-17 MED ORDER — LIDOCAINE-EPINEPHRINE (PF) 2 %-1:200000 IJ SOLN
INTRAMUSCULAR | Status: AC
  Filled 2023-02-17: qty 20

## 2023-02-17 MED ORDER — TETANUS-DIPHTH-ACELL PERTUSSIS 5-2.5-18.5 LF-MCG/0.5 IM SUSY
0.5000 mL | PREFILLED_SYRINGE | Freq: Once | INTRAMUSCULAR | Status: AC
  Administered 2023-02-17: 0.5 mL via INTRAMUSCULAR
  Filled 2023-02-17: qty 0.5

## 2023-02-17 MED ORDER — MUPIROCIN CALCIUM 2 % EX CREA: 1.0000 | TOPICAL_CREAM | Freq: Two times a day (BID) | CUTANEOUS | 0 refills | Status: AC

## 2023-02-17 MED ORDER — NAPROXEN 500 MG PO TABS: 500.0000 mg | ORAL_TABLET | Freq: Two times a day (BID) | ORAL | 0 refills | Status: AC

## 2023-02-17 MED ORDER — POVIDONE-IODINE 10 % EX SOLN
CUTANEOUS | Status: AC
  Filled 2023-02-17: qty 29.6

## 2023-02-17 NOTE — ED Provider Notes (Signed)
[place] EMERGENCY DEPARTMENT AT [facility] Provider Note   CSN: [id number] Arrival date & time: 02/17/23  1911     History  Chief Complaint  Patient presents with   Fall    Caitlin Miller is a 72 y.o. female.   Fall   This patient is a 72 year old female, she has no anticoagulants, she does have a history of hypertension currently under her family doctor's care for that.  Presents after having an accidental fall when her heel got caught getting out of the car.  She fell forward striking her lip on a cement step and trying to stop her self as she fell with her right hand, she is right-hand dominant.  This caused a laceration over the right ulnar aspect of the hand.  The patient has not had any other symptoms, no vomiting diarrhea shortness of breath headache neck pain numbness or weakness.  She does not drink alcohol.    Home Medications Prior to Admission medications   Medication Sig Start Date End Date Taking? Authorizing Provider  fluticasone-salmeterol (ADVAIR) 100-50 MCG/ACT AEPB Inhale 1 puff into the lungs 2 (two) times daily.   Yes [provider]  metFORMIN (GLUMETZA) 1000 MG (MOD) 24 hr tablet Take 1,000 mg by mouth daily with breakfast.   Yes [provider]  mupirocin cream (BACTROBAN) 2 % Apply 1 Application topically 2 (two) times daily. 02/17/23  Yes Eber Hong, MD  naproxen (NAPROSYN) 500 MG tablet Take 1 tablet (500 mg total) by mouth 2 (two) times daily with a meal. 02/17/23  Yes Eber Hong, MD      Allergies    Patient has no allergy information on record.    Review of Systems   Review of Systems  All other systems reviewed and are negative.   Physical Exam Updated Vital Signs BP (!) 151/89   Pulse (!) 113   Temp 98.9 F (37.2 C) (Oral)   Resp 20   Ht 1.727 m (5\' 8" )   Wt 98.9 kg   SpO2 98%   BMI 33.15 kg/m  Physical Exam Vitals and nursing note reviewed.  Constitutional:      General: She is not in acute  distress.    Appearance: She is well-developed.  HENT:     Head: Normocephalic and atraumatic.     Mouth/Throat:     Pharynx: No oropharyngeal exudate.  Eyes:     General: No scleral icterus.       Right eye: No discharge.        Left eye: No discharge.     Conjunctiva/sclera: Conjunctivae normal.     Pupils: Pupils are equal, round, and reactive to light.  Neck:     Thyroid: No thyromegaly.     Vascular: No JVD.  Cardiovascular:     Rate and Rhythm: Normal rate and regular rhythm.     Heart sounds: Normal heart sounds. No murmur heard.    No friction rub. No gallop.  Pulmonary:     Effort: Pulmonary effort is normal. No respiratory distress.     Breath sounds: Normal breath sounds. No wheezing or rales.  Abdominal:     General: Bowel sounds are normal. There is no distension.     Palpations: Abdomen is soft. There is no mass.     Tenderness: There is no abdominal tenderness.  Musculoskeletal:        General: No tenderness. Normal range of motion.     Cervical back: Normal range of  motion and neck supple.  Lymphadenopathy:     Cervical: No cervical adenopathy.  Skin:    General: Skin is warm and dry.     Findings: No erythema or rash.     Comments: Laceration noted to the right hand over the ulnar surface over the fifth metacarpal, there is a subtle skin tear and very superficial laceration to the lower lip with a chip to the right upper central incisor.  There is no pain with opening and closing the mouth, there is no jaw tenderness, no malocclusion.  There is no involvement of the vermilion border  Neurological:     Mental Status: She is alert.     Coordination: Coordination normal.  Psychiatric:        Behavior: Behavior normal.     ED Results / Procedures / Treatments   Labs (all labs ordered are listed, but only abnormal results are displayed) Labs Reviewed - No data to display  EKG None  Radiology DG Hand Complete Right  Result Date: 02/17/2023 CLINICAL  DATA:  Fifth metacarpal pain, trauma. EXAM: RIGHT HAND - COMPLETE 3+ VIEW COMPARISON:  None Available. FINDINGS: There is no evidence of fracture or dislocation. There are moderate degenerative changes of the first carpometacarpal joint, second, third and fourth proximal interphalangeal joints, and third and fifth distal interphalangeal joints. Soft tissues are unremarkable. No foreign body identified. IMPRESSION: 1. No acute fracture or dislocation. 2. Degenerative changes as above. Electronically Signed   By: Darliss Cheney M.D.   On: 02/17/2023 20:37    Procedures .Marland KitchenLaceration Repair  Date/Time: 02/17/2023 10:54 PM  Performed by: Eber Hong, MD Authorized by: Eber Hong, MD   Consent:    Consent obtained:  Verbal   Consent given by:  Patient   Risks, benefits, and alternatives were discussed: yes     Risks discussed:  Infection   Alternatives discussed:  No treatment Universal protocol:    Procedure explained and questions answered to patient or proxy's satisfaction: yes     Relevant documents present and verified: yes     Test results available: yes     Imaging studies available: yes     Required blood products, implants, devices, and special equipment available: yes     Site/side marked: yes     Immediately prior to procedure, a time out was called: yes     Patient identity confirmed:  Verbally with patient Anesthesia:    Anesthesia method:  Local infiltration   Local anesthetic:  Lidocaine 2% WITH epi Laceration details:    Location:  Hand   Hand location:  R hand, dorsum   Length (cm):  6   Depth (mm):  4 Pre-procedure details:    Preparation:  Patient was prepped and draped in usual sterile fashion and imaging obtained to evaluate for foreign bodies Exploration:    Imaging obtained: x-ray     Imaging outcome: foreign body not noted     Wound exploration: wound explored through full range of motion and entire depth of wound visualized     Contaminated: no   Treatment:     Area cleansed with:  Povidone-iodine   Amount of cleaning:  Extensive   Irrigation solution:  Sterile saline   Debridement:  None Skin repair:    Repair method:  Sutures   Suture size:  5-0   Suture material:  Prolene   Suture technique:  Simple interrupted   Number of sutures:  6 Approximation:    Approximation:  Close Repair  type:    Repair type:  Simple Post-procedure details:    Dressing:  Antibiotic ointment and sterile dressing   Procedure completion:  Tolerated well, no immediate complications Comments:           Medications Ordered in ED Medications  bacitracin ointment (has no administration in time range)  Tdap (BOOSTRIX) injection 0.5 mL (0.5 mLs Intramuscular Given 02/17/23 2017)  lidocaine-EPINEPHrine (XYLOCAINE W/EPI) 2 %-1:200000 (PF) injection (  Given by Other 02/17/23 2252)  povidone-iodine (BETADINE) 10 % external solution (  Given by Other 02/17/23 2253)    ED Course/ Medical Decision Making/ A&P                                 Medical Decision Making Amount and/or Complexity of Data Reviewed Radiology: ordered.  Risk OTC drugs. Prescription drug management.   No significant head injury that would require imaging of the head however will image the right hand, tetanus update, irrigate and close the wound, the patient is agreeable.  Will make sure there is no signs of fractures prior to closing.  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  Hypertension is up but again given the clinical scenario not concerned about some transient hypertension.  The patient was informed of her x-ray results and the reassuring nature, she was also informed of wound care including topical antibiotic sterile dressings and suture care.  She is agreeable to follow-up.  She has a couple of chips of her teeth which are very tiny and she can follow-up with a dentist for this, there is no malocclusion or other signs of head injury.  There is no  need for laceration closure of the lip as it is superficial and on the mucosal surface.  Stable for discharge, expressed her understanding        Final Clinical Impression(s) / ED Diagnoses Final diagnoses:  Laceration of right hand without foreign body, initial encounter  Contusion of right knee, initial encounter    Rx / DC Orders ED Discharge Orders          Ordered    mupirocin cream (BACTROBAN) 2 %  2 times daily        02/17/23 2254    naproxen (NAPROSYN) 500 MG tablet  2 times daily with meals        02/17/23 2254              Eber Hong, MD 02/17/23 2257

## 2023-02-17 NOTE — Discharge Instructions (Signed)
Naprosyn for pain twice daily  Mupirocin for topical antibiotic  ER for worsenin pain, redness, swelling, fever or pus.  See your doctor within 10 days for check of the wound and removal of stitches.

## 2023-02-17 NOTE — ED Triage Notes (Signed)
Pt to ED after mechanical fall, tripping over heel on shoe and falling into concrete step. Pt with injuries to lip and right hand. Bleeding minimal at this time. EDP in room to assess upon triage. No blood thinners.

## 2023-02-26 DIAGNOSIS — S61411D Laceration without foreign body of right hand, subsequent encounter: Secondary | ICD-10-CM | POA: Diagnosis not present

## 2023-02-26 DIAGNOSIS — Z4802 Encounter for removal of sutures: Secondary | ICD-10-CM | POA: Diagnosis not present

## 2023-02-26 DIAGNOSIS — I10 Essential (primary) hypertension: Secondary | ICD-10-CM | POA: Diagnosis not present

## 2023-03-14 ENCOUNTER — Telehealth: Payer: Self-pay | Admitting: *Deleted

## 2023-03-14 NOTE — Telephone Encounter (Signed)
Transition Care Management Unsuccessful Follow-up Telephone Call  Date of discharge and from where:  [facility]  02/18/2023  Attempts:  1st Attempt  Reason for unsuccessful TCM follow-up call:  No answer/busy

## 2023-05-12 DIAGNOSIS — I10 Essential (primary) hypertension: Secondary | ICD-10-CM | POA: Diagnosis not present

## 2023-05-12 DIAGNOSIS — J4530 Mild persistent asthma, uncomplicated: Secondary | ICD-10-CM | POA: Diagnosis not present

## 2023-05-12 DIAGNOSIS — E1165 Type 2 diabetes mellitus with hyperglycemia: Secondary | ICD-10-CM | POA: Diagnosis not present

## 2023-05-12 DIAGNOSIS — E782 Mixed hyperlipidemia: Secondary | ICD-10-CM | POA: Diagnosis not present

## 2023-06-12 DIAGNOSIS — E782 Mixed hyperlipidemia: Secondary | ICD-10-CM | POA: Diagnosis not present

## 2023-06-12 DIAGNOSIS — E1165 Type 2 diabetes mellitus with hyperglycemia: Secondary | ICD-10-CM | POA: Diagnosis not present

## 2023-10-08 DIAGNOSIS — M170 Bilateral primary osteoarthritis of knee: Secondary | ICD-10-CM | POA: Diagnosis not present

## 2023-10-08 DIAGNOSIS — E1165 Type 2 diabetes mellitus with hyperglycemia: Secondary | ICD-10-CM | POA: Diagnosis not present

## 2023-10-08 DIAGNOSIS — E7849 Other hyperlipidemia: Secondary | ICD-10-CM | POA: Diagnosis not present

## 2023-10-08 DIAGNOSIS — I10 Essential (primary) hypertension: Secondary | ICD-10-CM | POA: Diagnosis not present

## 2023-10-08 DIAGNOSIS — J4530 Mild persistent asthma, uncomplicated: Secondary | ICD-10-CM | POA: Diagnosis not present

## 2023-12-10 DIAGNOSIS — J4530 Mild persistent asthma, uncomplicated: Secondary | ICD-10-CM | POA: Diagnosis not present

## 2023-12-10 DIAGNOSIS — E782 Mixed hyperlipidemia: Secondary | ICD-10-CM | POA: Diagnosis not present

## 2023-12-10 DIAGNOSIS — E1165 Type 2 diabetes mellitus with hyperglycemia: Secondary | ICD-10-CM | POA: Diagnosis not present

## 2023-12-10 DIAGNOSIS — I10 Essential (primary) hypertension: Secondary | ICD-10-CM | POA: Diagnosis not present

## 2024-01-29 DIAGNOSIS — E119 Type 2 diabetes mellitus without complications: Secondary | ICD-10-CM | POA: Diagnosis not present
